# Patient Record
Sex: Female | Born: 1995 | Race: White | Hispanic: No
Health system: Southern US, Community
[De-identification: ages and names within clinical notes are randomized; demographics above are authoritative.]

---

## 2014-02-23 ENCOUNTER — Emergency Department: Payer: Self-pay | Admitting: Emergency Medicine

## 2014-02-23 LAB — COMPREHENSIVE METABOLIC PANEL
ALK PHOS: 63 U/L
ANION GAP: 7 (ref 7–16)
Albumin: 2.7 g/dL — ABNORMAL LOW (ref 3.4–5.0)
BUN: 5 mg/dL — AB (ref 7–18)
Bilirubin,Total: 0.2 mg/dL (ref 0.2–1.0)
CHLORIDE: 108 mmol/L — AB (ref 98–107)
Calcium, Total: 8.3 mg/dL — ABNORMAL LOW (ref 8.5–10.1)
Co2: 23 mmol/L (ref 21–32)
Creatinine: 0.49 mg/dL — ABNORMAL LOW (ref 0.60–1.30)
EGFR (African American): >60
GLUCOSE: 66 mg/dL (ref 65–99)
Osmolality: 271 (ref 275–301)
POTASSIUM: 3.7 mmol/L (ref 3.5–5.1)
SGOT(AST): 19 U/L (ref 15–37)
SGPT (ALT): 16 U/L (ref 12–78)
Sodium: 138 mmol/L (ref 136–145)
TOTAL PROTEIN: 6.7 g/dL (ref 6.4–8.2)

## 2014-02-23 LAB — CBC WITH DIFFERENTIAL/PLATELET
Basophil #: 0 10*3/uL (ref 0.0–0.1)
Basophil %: 0.2 %
Eosinophil #: 0.1 10*3/uL (ref 0.0–0.7)
Eosinophil %: 0.7 %
HCT: 31.6 % — ABNORMAL LOW (ref 35.0–47.0)
HGB: 9.9 g/dL — AB (ref 12.0–16.0)
LYMPHS PCT: 32.2 %
Lymphocyte #: 3.7 10*3/uL — ABNORMAL HIGH (ref 1.0–3.6)
MCH: 21.5 pg — AB (ref 26.0–34.0)
MCHC: 31.2 g/dL — AB (ref 32.0–36.0)
MCV: 69 fL — ABNORMAL LOW (ref 80–100)
MONOS PCT: 9.5 %
Monocyte #: 1.1 x10 3/mm — ABNORMAL HIGH (ref 0.2–0.9)
Neutrophil #: 6.6 10*3/uL — ABNORMAL HIGH (ref 1.4–6.5)
Neutrophil %: 57.4 %
Platelet: 188 10*3/uL (ref 150–440)
RBC: 4.6 10*6/uL (ref 3.80–5.20)
RDW: 16.4 % — ABNORMAL HIGH (ref 11.5–14.5)
WBC: 11.6 10*3/uL — ABNORMAL HIGH (ref 3.6–11.0)

## 2014-02-23 LAB — HCG, QUANTITATIVE, PREGNANCY: Beta Hcg, Quant.: 24076 m[IU]/mL — ABNORMAL HIGH

## 2014-02-23 LAB — URINALYSIS, COMPLETE
Bilirubin,UR: NEGATIVE
Blood: NEGATIVE
GLUCOSE, UR: NEGATIVE mg/dL (ref 0–75)
Ketone: NEGATIVE
NITRITE: NEGATIVE
Ph: 8 (ref 4.5–8.0)
Protein: NEGATIVE
RBC,UR: 1 /HPF (ref 0–5)
SPECIFIC GRAVITY: 1.015 (ref 1.003–1.030)
WBC UR: 11 /HPF (ref 0–5)

## 2014-02-23 LAB — WET PREP, GENITAL

## 2014-02-23 IMAGING — US US OB LIMITED
1 series · 14 of 22 positions shown · non-contrast
Comparison: none

CLINICAL DATA: Pelvic pain.  Second trimester pregnancy.

EXAM:
LIMITED OBSTETRIC ULTRASOUND

[Series 1: us ob limited · 0.22mm/px · 14 of 22 slices shown]
[im 1/22]
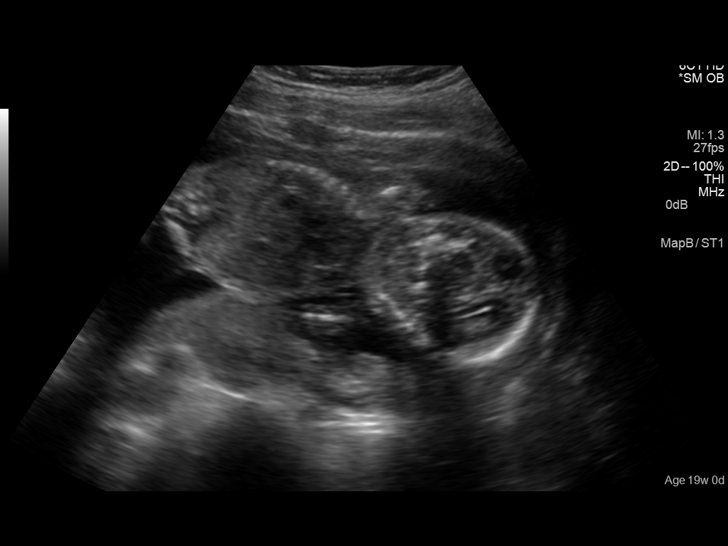
[im 3/22]
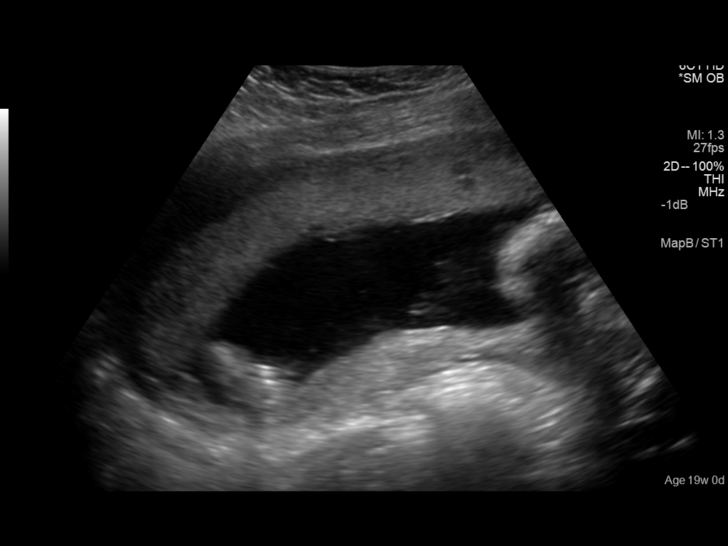
[im 4/22]
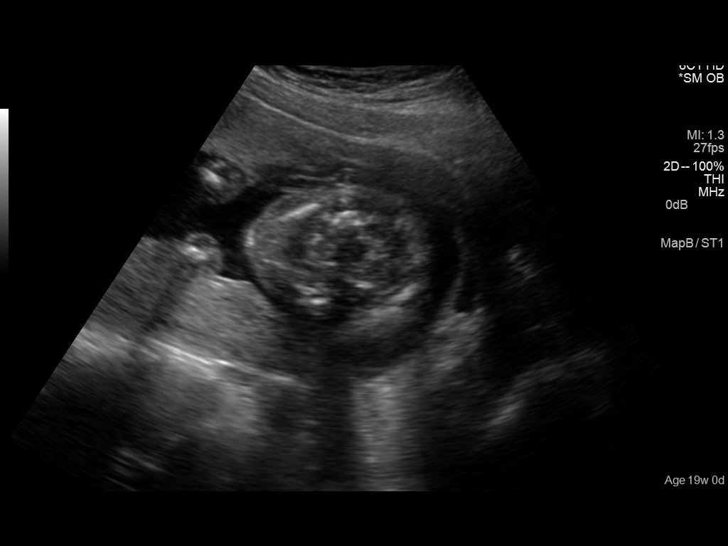
[im 6/22]
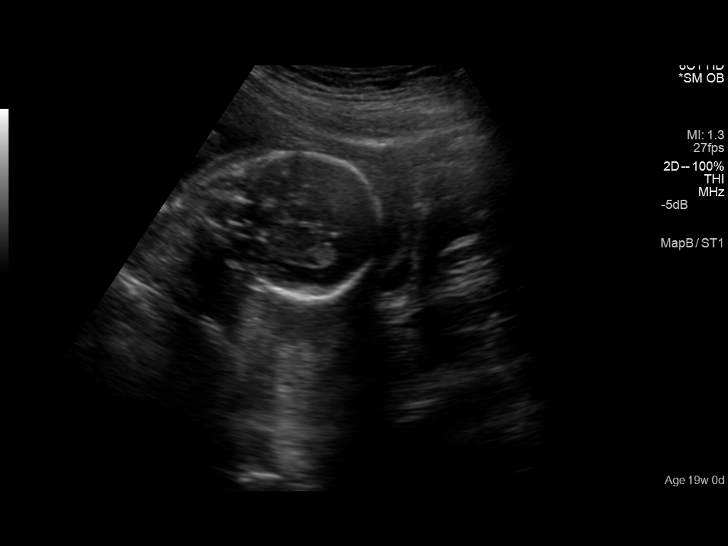
[im 8/22]
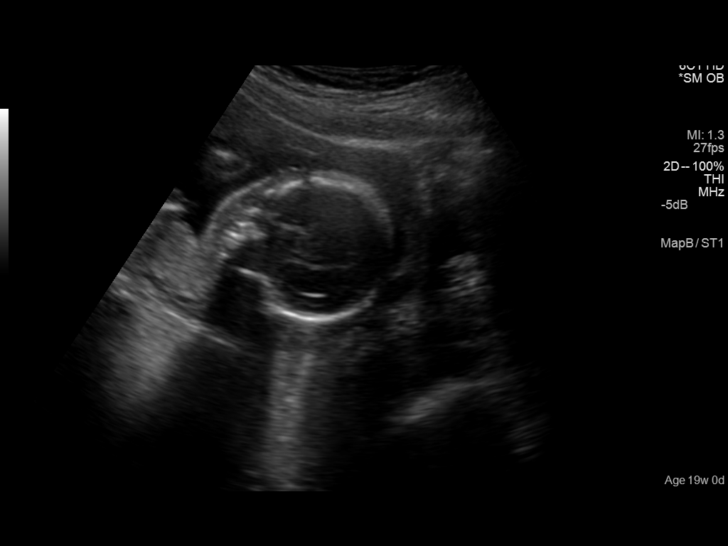
[im 9/22]
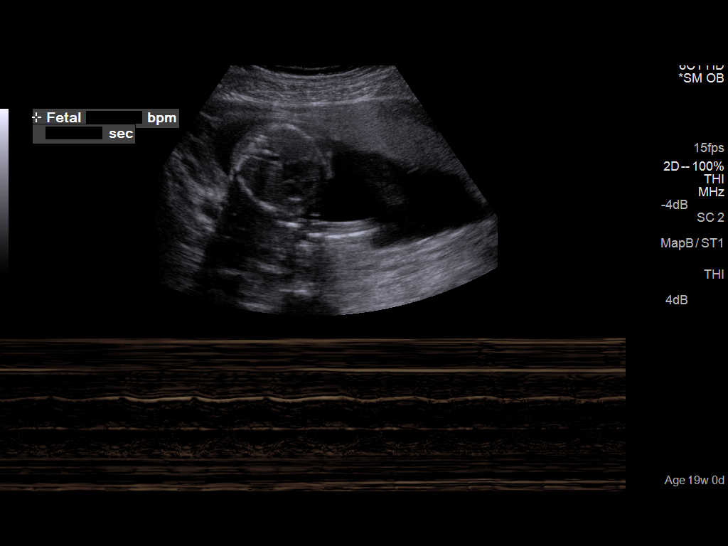
[im 11/22]
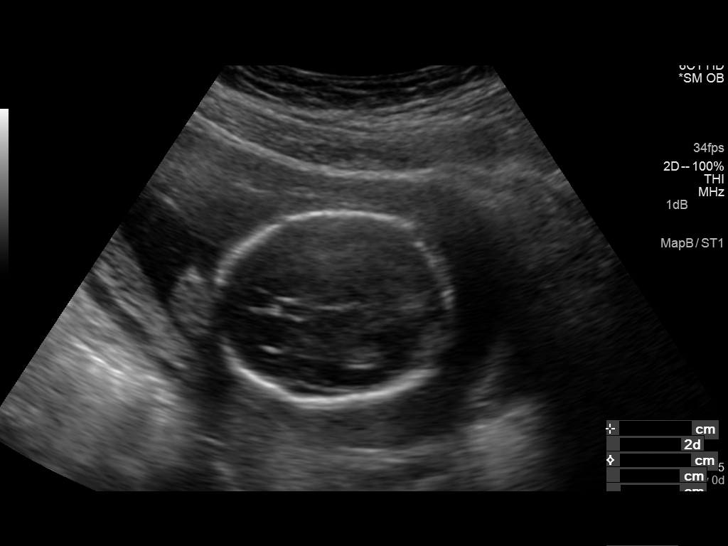
[im 12/22]
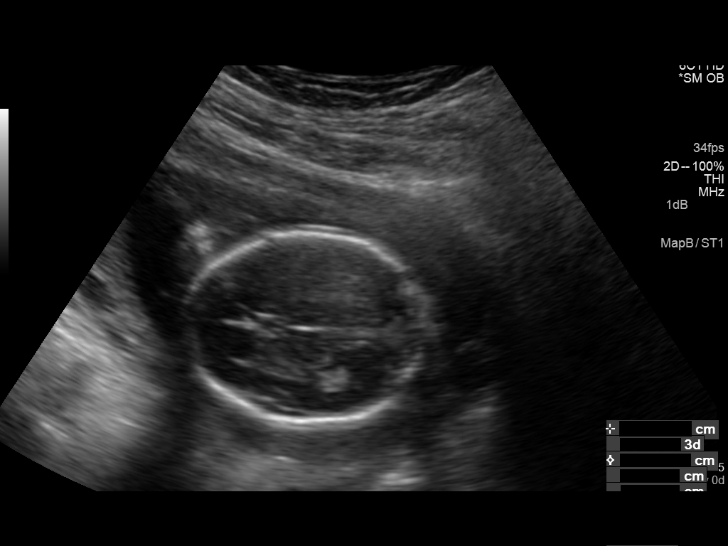
[im 14/22]
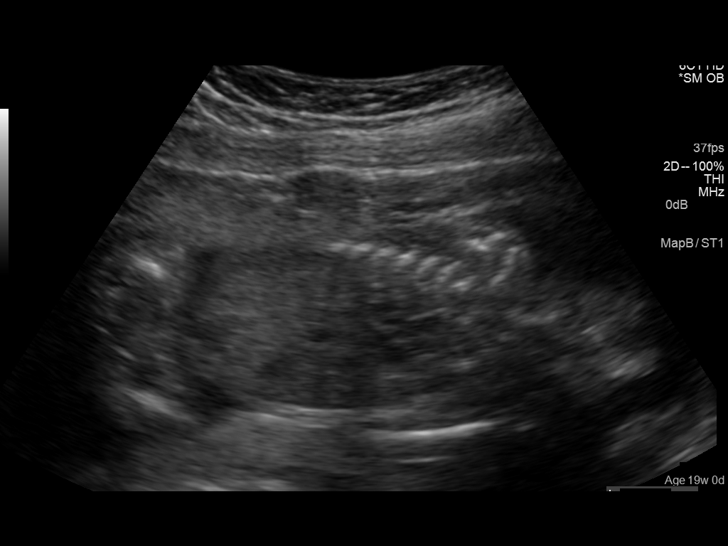
[im 15/22]
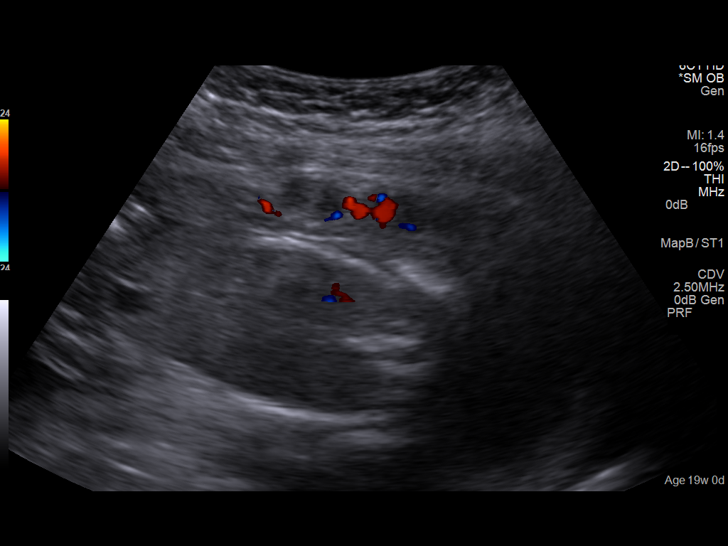
[im 17/22]
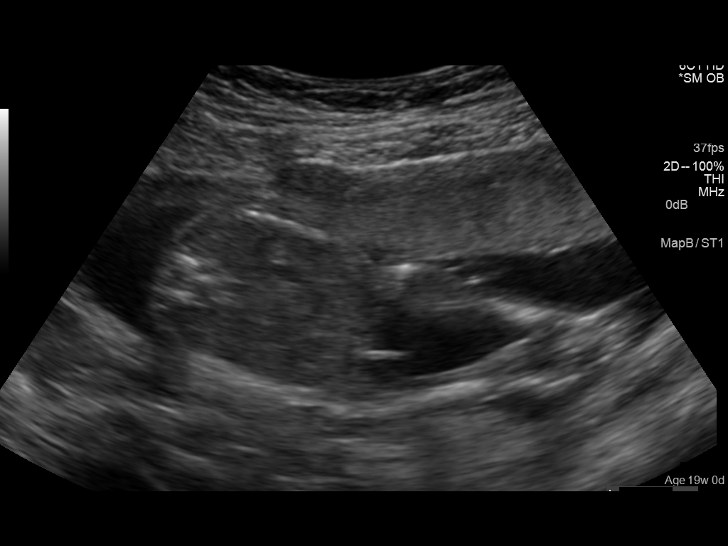
[im 19/22]
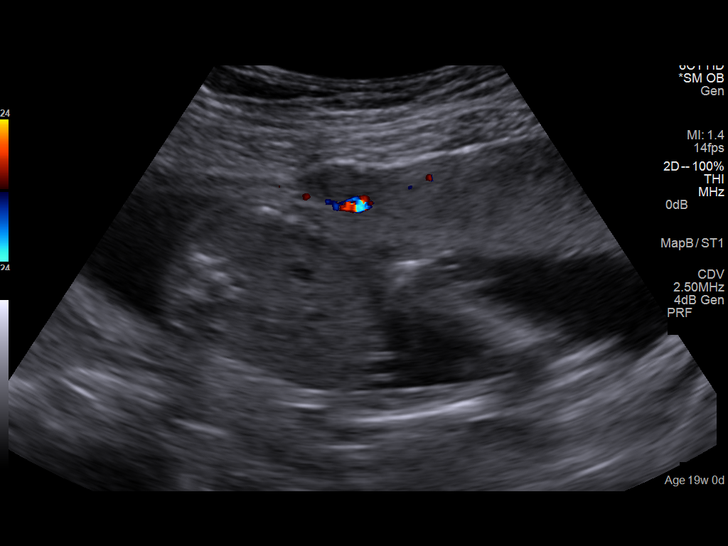
[im 20/22]
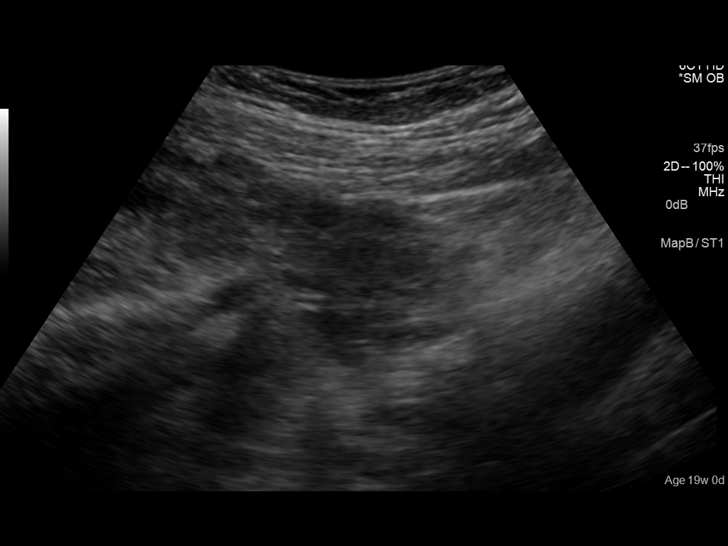
[im 22/22]
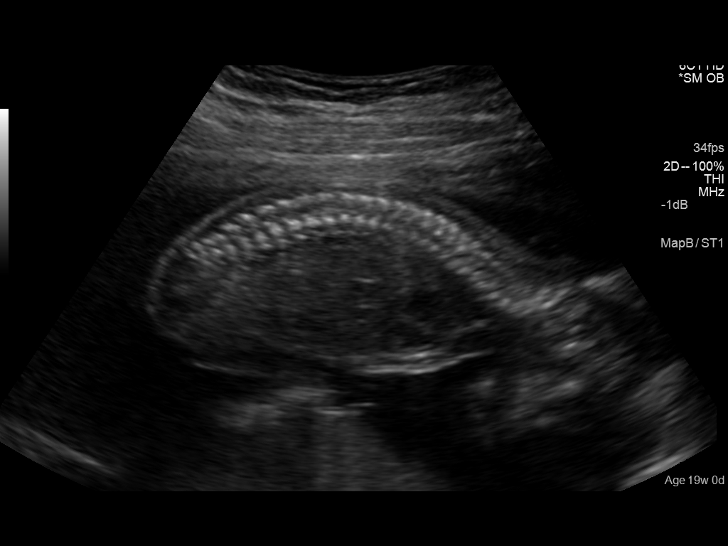

[14 of 22 positions shown; findings below may reference images not displayed]

FINDINGS: Number of Fetuses: 1

Heart Rate:  143 bpm

Movement: Yes

Presentation: Cephalic

Placental Location: Anterior

Previa: No

Amniotic Fluid (Subjective):  Within normal limits.

BPD:  4.4 cm 19w  2d

MATERNAL FINDINGS:

Cervix:  Appears closed.

Uterus/Adnexae: Small uterine fibroid versus contraction along the
anterior wall, 1.6 x 1.0 x 1.7 cm.
IMPRESSION: 1. No acute complicating feature of the pregnancy to explain the
patient's pelvic pain.

This exam is performed on an emergent basis and does not
comprehensively evaluate fetal size, dating, or anatomy; follow-up
complete OB US should be considered if further fetal assessment is
warranted.

## 2014-07-12 ENCOUNTER — Observation Stay: Payer: Self-pay | Admitting: Student

## 2014-10-13 ENCOUNTER — Ambulatory Visit: Payer: Self-pay | Admitting: Physician Assistant

## 2014-10-19 ENCOUNTER — Ambulatory Visit: Payer: Self-pay | Admitting: Physician Assistant
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
